# Patient Record
Sex: Male | Born: 1995 | Race: White | Hispanic: No | Marital: Single | Smoking: Never smoker
Health system: Southern US, Community
[De-identification: ages and names within clinical notes are randomized; demographics above are authoritative.]

## PROBLEM LIST (undated history)

## (undated) DIAGNOSIS — J45909 Unspecified asthma, uncomplicated: Secondary | ICD-10-CM

## (undated) HISTORY — DX: Unspecified asthma, uncomplicated: J45.909

## (undated) HISTORY — PX: OTHER SURGICAL HISTORY: SHX169

---
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
## 2015-01-14 ENCOUNTER — Telehealth: Payer: Self-pay | Admitting: Internal Medicine

## 2015-01-14 MED ORDER — ALBUTEROL SULFATE HFA 108 (90 BASE) MCG/ACT IN AERS: 2.0000 | INHALATION_SPRAY | Freq: Four times a day (QID) | RESPIRATORY_TRACT | Status: AC | PRN

## 2015-01-14 NOTE — Telephone Encounter (Signed)
[facility] insurance will not cover Ventolin but will Proair. Called and spoke to pt's mother and informed her of the changed. Pt's mother verbalized understanding and [facility] Nothing further needed.

## 2015-09-29 ENCOUNTER — Ambulatory Visit (INDEPENDENT_AMBULATORY_CARE_PROVIDER_SITE_OTHER): Payer: Managed Care, Other (non HMO)

## 2015-09-29 DIAGNOSIS — Z23 Encounter for immunization: Secondary | ICD-10-CM

## 2016-01-24 ENCOUNTER — Other Ambulatory Visit: Payer: Self-pay | Admitting: Internal Medicine

## 2016-04-19 ENCOUNTER — Encounter: Payer: Self-pay | Admitting: Family Medicine

## 2016-04-19 ENCOUNTER — Ambulatory Visit (INDEPENDENT_AMBULATORY_CARE_PROVIDER_SITE_OTHER): Payer: Managed Care, Other (non HMO) | Admitting: Family Medicine

## 2016-04-19 ENCOUNTER — Ambulatory Visit (INDEPENDENT_AMBULATORY_CARE_PROVIDER_SITE_OTHER): Payer: Managed Care, Other (non HMO)

## 2016-04-19 VITALS — BP 136/76 | HR 88 | Temp 98.1°F | Ht 74.0 in | Wt 270.8 lb

## 2016-04-19 DIAGNOSIS — S86911A Strain of unspecified muscle(s) and tendon(s) at lower leg level, right leg, initial encounter: Secondary | ICD-10-CM

## 2016-04-19 DIAGNOSIS — S86811A Strain of other muscle(s) and tendon(s) at lower leg level, right leg, initial encounter: Secondary | ICD-10-CM | POA: Diagnosis not present

## 2016-04-19 NOTE — Patient Instructions (Signed)
Use conservative measures with ibuprofen or Aleve and ice and elevation and gentle stretching, if not improved and still having giving him catching and popping in a week and then give us a call back and we will send orthopedic

## 2016-04-19 NOTE — Progress Notes (Signed)
BP 136/76 mmHg  Pulse 88  Temp(Src) 98.1 F (36.7 C) (Oral)  Ht 6\' 2"  (1.88 m)  Wt 270 lb 12.8 oz (122.834 kg)  BMI 34.75 kg/m2   Subjective:    Patient ID: Bryan French, male    DOB: 09/14/1996, 20 y.o.   MRN: [id number]  HPI: Bryan French is a 20 y.o. male presenting on 04/19/2016 for Knee Pain   HPI Right knee pain While playing basketball patient came down with his right knee on a basketball who at age and twisted his knee. This occurred 2 days ago and since that time he has been having some swelling in that knee and feels like it does give way on him sometimes but not necessary upstairs or downstairs but just randomly. He is able to walk on it for the most part. He also went down and had an anterior abrasion on his knee. He is able to weight-bear and has no decreased sensation or strength in that lower extremity.  Relevant past medical, surgical, family and social history reviewed and updated as indicated. Interim medical history since our last visit reviewed. Allergies and medications reviewed and updated.  Review of Systems  Constitutional: Negative for fever.  HENT: Negative for ear discharge and ear pain.   Eyes: Negative for discharge and visual disturbance.  Respiratory: Negative for shortness of breath and wheezing.   Cardiovascular: Negative for chest pain and leg swelling.  Gastrointestinal: Negative for abdominal pain, diarrhea and constipation.  Genitourinary: Negative for difficulty urinating.  Musculoskeletal: Positive for joint swelling and arthralgias. Negative for back pain and gait problem.  Skin: Negative for rash.  Neurological: Negative for syncope, light-headedness and headaches.  All other systems reviewed and are negative.   Per HPI unless specifically indicated above     Medication List       This list is accurate as of: 04/19/16  4:53 PM.  Always use your most recent med list.               albuterol 108 (90 Base) MCG/ACT inhaler    Commonly known as:  PROAIR HFA  Inhale 2 puffs into the lungs every 6 (six) hours as needed for wheezing or shortness of breath.           Objective:    BP 136/76 mmHg  Pulse 88  Temp(Src) 98.1 F (36.7 C) (Oral)  Ht 6\' 2"  (1.88 m)  Wt 270 lb 12.8 oz (122.834 kg)  BMI 34.75 kg/m2  Wt Readings from Last 3 Encounters:  04/19/16 270 lb 12.8 oz (122.834 kg)  03/10/14 244 lb 3.2 oz (110.768 kg) (99 %*, Z = 2.36)  01/22/14 244 lb 3.2 oz (110.768 kg) (99 %*, Z = 2.37)   * Growth percentiles are based on CDC 2-20 Years data.    Physical Exam  Constitutional: He is oriented to person, place, and time. He appears well-developed and well-nourished. No distress.  Eyes: Conjunctivae and EOM are normal. Pupils are equal, round, and reactive to light. Right eye exhibits no discharge. No scleral icterus.  Neck: Neck supple. No thyromegaly present.  Cardiovascular: Normal rate, regular rhythm, normal heart sounds and intact distal pulses.   No murmur heard. Pulmonary/Chest: Effort normal and breath sounds normal. No respiratory distress. He has no wheezes.  Musculoskeletal: Normal range of motion. He exhibits tenderness (Right diffuse knee pain with mild effusion. No overlying erythema or warmth. No grinding popping or catching or weak ligaments are noted on exam.). He  exhibits no edema.  Lymphadenopathy:    He has no cervical adenopathy.  Neurological: He is alert and oriented to person, place, and time. Coordination normal.  Skin: Skin is warm and dry. No rash noted. He is not diaphoretic.  Psychiatric: He has a normal mood and affect. His behavior is normal.  Nursing note and vitals reviewed.     Assessment & Plan:   Problem List Items Addressed This Visit    None    Visit Diagnoses    Knee strain, right, initial encounter    -  Primary    Relevant Orders    DG Knee 1-2 Views Right       Use conservative measures with ibuprofen or Aleve and ice and elevation and gentle  stretching, if not improved and still having giving him catching and popping in a week and then give us a call back and we will send orthopedic  Follow up plan: Return if symptoms worsen or fail to improve.  Counseling provided for all of the vaccine components Orders Placed This Encounter  Procedures  . DG Knee 1-2 Views Right    Arville CareJoshua Jimena Wieczorek, MD [facility] 04/19/2016, 4:53 PM

## 2016-04-21 NOTE — Progress Notes (Signed)
Patient aware.

## 2016-09-07 ENCOUNTER — Ambulatory Visit (INDEPENDENT_AMBULATORY_CARE_PROVIDER_SITE_OTHER): Payer: Managed Care, Other (non HMO)

## 2016-09-07 DIAGNOSIS — Z23 Encounter for immunization: Secondary | ICD-10-CM | POA: Diagnosis not present

## 2017-04-23 ENCOUNTER — Encounter: Payer: Self-pay | Admitting: Family Medicine

## 2017-04-23 ENCOUNTER — Ambulatory Visit (INDEPENDENT_AMBULATORY_CARE_PROVIDER_SITE_OTHER): Payer: Managed Care, Other (non HMO) | Admitting: Family Medicine

## 2017-04-23 VITALS — BP 126/74 | HR 67 | Temp 98.0°F | Ht 74.0 in | Wt 262.4 lb

## 2017-04-23 DIAGNOSIS — J029 Acute pharyngitis, unspecified: Secondary | ICD-10-CM

## 2017-04-23 DIAGNOSIS — J039 Acute tonsillitis, unspecified: Secondary | ICD-10-CM

## 2017-04-23 LAB — RAPID STREP SCREEN (MED CTR MEBANE ONLY): STREP GP A AG, IA W/REFLEX: NEGATIVE

## 2017-04-23 LAB — CULTURE, GROUP A STREP

## 2017-04-23 MED ORDER — DOXYCYCLINE HYCLATE 100 MG PO TABS: 100.0000 mg | ORAL_TABLET | Freq: Every day | ORAL | 5 refills | Status: AC

## 2017-04-23 MED ORDER — AMOXICILLIN-POT CLAVULANATE 875-125 MG PO TABS: 1.0000 | ORAL_TABLET | Freq: Two times a day (BID) | ORAL | 0 refills | Status: AC

## 2017-04-23 NOTE — Progress Notes (Signed)
Chief Complaint  Patient presents with  . Adenopathy  . Nausea    HPI  Patient presents today for 3-4 days swelling in the back on the right side. It is sore when swallowing but he is able to eat normally. He is having about 3 loose bowel movements per day for the last 2-3 days. No fever chills sweats. And no abdominal discomfort. Patient also states that he developed a case of acne over the last few weeks although he never really had any significant amount while in his teens. He is a weight lifter but he denies use of steroids. Acne located primarily on the upper back shoulders and upper arms.  PMH: Smoking status noted ROS: Review of Systems  Constitutional: Negative for chills, diaphoresis and fever.  HENT: Positive for sore throat. Negative for congestion.   Eyes: Negative.   Respiratory: Negative for cough and shortness of breath.   Cardiovascular: Negative for chest pain and palpitations.  Gastrointestinal: Positive for diarrhea. Negative for abdominal pain, nausea and vomiting.  Musculoskeletal: Negative for joint pain.  Skin: Negative for rash.  Neurological: Positive for headaches. Negative for dizziness.  Psychiatric/Behavioral: Negative.      Objective: BP 126/74   Pulse 67   Temp 98 F (36.7 C) (Oral)   Ht 6\' 2"  (1.88 m)   Wt 262 lb 6.4 oz (119 kg)   BMI 33.69 kg/m  Gen: NAD, alert, cooperative with exam HEENT: NCAT, EOMI, PERRL CV: RRR, good S1/S2, no murmur Resp: CTABL, no wheezes, non-labored Abd: SNTND, BS present, no guarding or organomegaly Ext: No edema, warm Neuro: Alert and oriented, No gross deficits  Assessment and plan:  1. Sore throat   2. Tonsillitis     Meds ordered this encounter  Medications  . DUEXIS 800-26.6 MG TABS    Sig: Take 1 tablet by mouth 3 (three) times daily as needed. for pain    Refill:  3  . amoxicillin-clavulanate (AUGMENTIN) 875-125 MG tablet    Sig: Take 1 tablet by mouth 2 (two) times daily. Take all of this  medication    Dispense:  20 tablet    Refill:  0  . doxycycline (VIBRA-TABS) 100 MG tablet    Sig: Take 1 tablet (100 mg total) by mouth daily.    Dispense:  30 tablet    Refill:  5    Orders Placed This Encounter  Procedures  . [facility] ([facility]  . Culture, Group A Strep    Order Specific Question:   Source    Answer:   oral  . Culture, Group A Strep    Follow up as needed.  Mechele ClaudeWarren Olukemi Panchal, MD

## 2017-04-23 NOTE — Patient Instructions (Signed)
Do not take the doxycycline for acne until you have finished taking the Augmentin for the tonsillitis.  Use proactive face cleansers and acne creams on your back shoulders and face for the acne. You can start that right away.  Imodium AD take 1-2 tablets 4 times a day until the diarrhea clears. It is available over-the-counter

## 2017-04-26 LAB — CULTURE, GROUP A STREP

## 2017-05-11 ENCOUNTER — Encounter: Payer: Self-pay | Admitting: *Deleted

## 2017-10-06 IMAGING — DX DG KNEE 1-2V*R*
2 series · 2 of 2 positions shown · non-contrast
Comparison: MRI of the right knee June 10, 2015

CLINICAL DATA: Twisting injury while playing basketball ; previous
ACL surgery.

EXAM:
RIGHT KNEE - 1-2 VIEW

[knee ap]
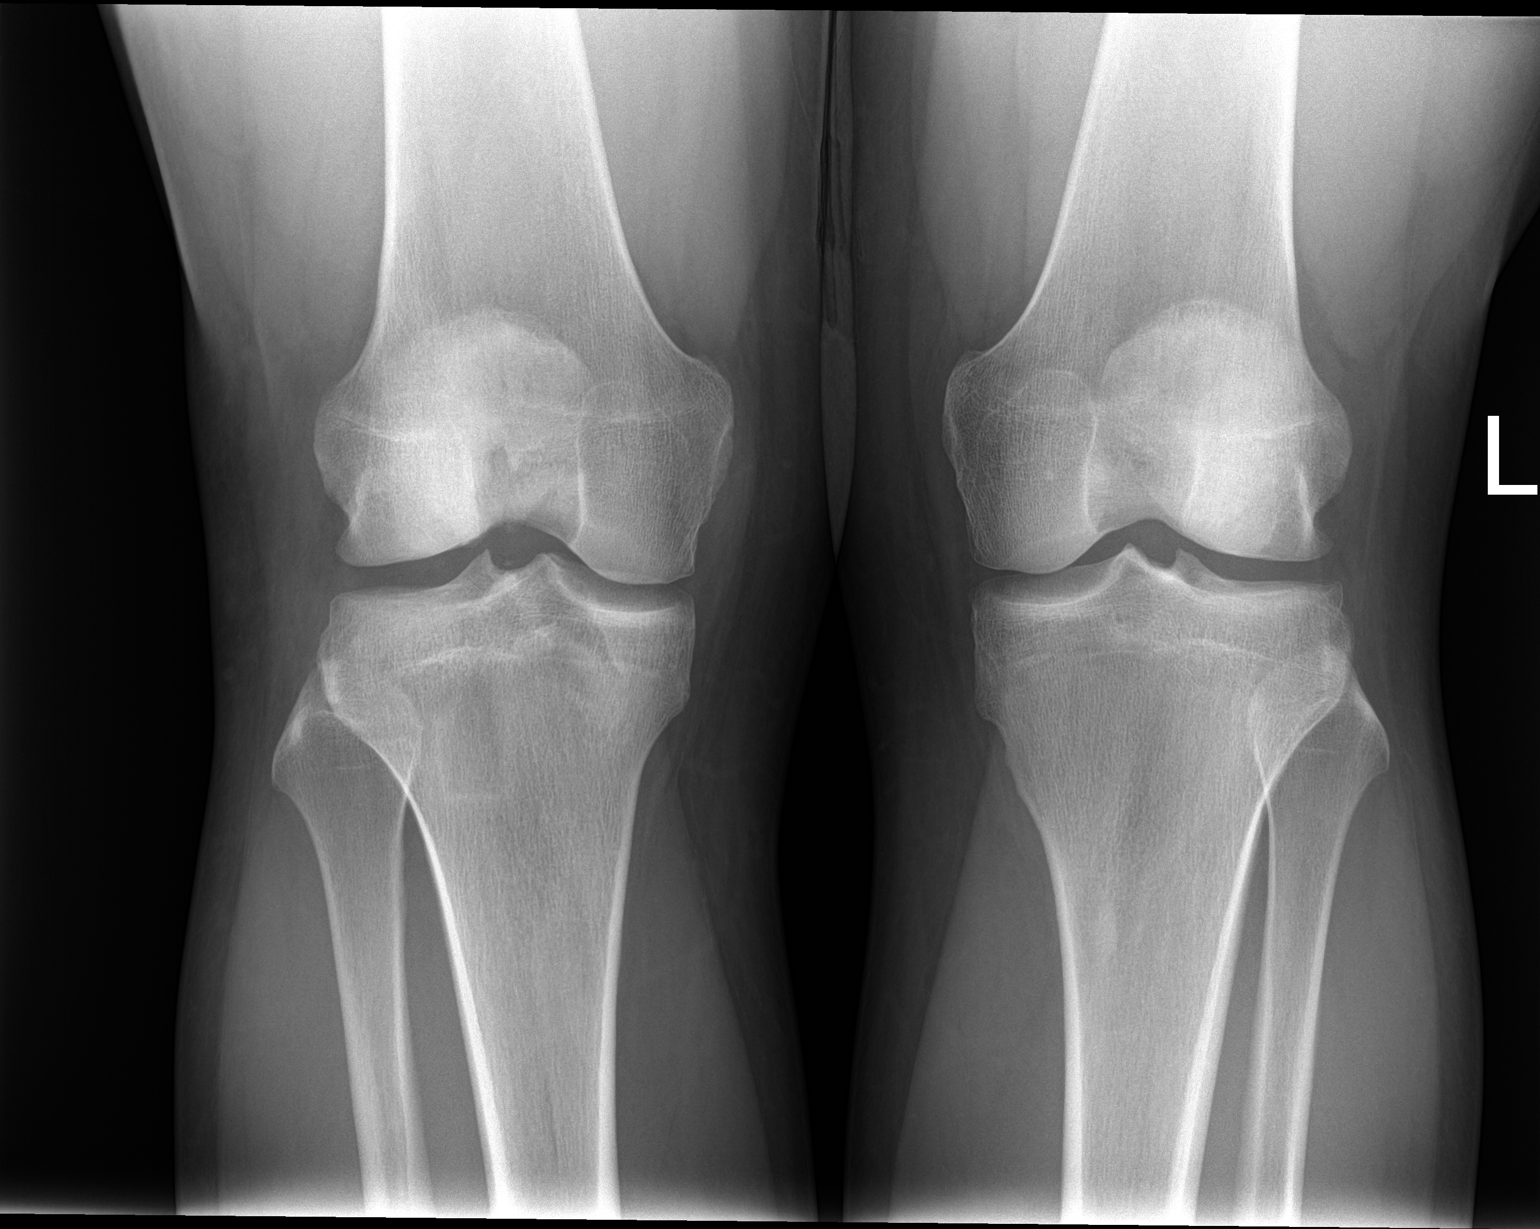

[knee lat]
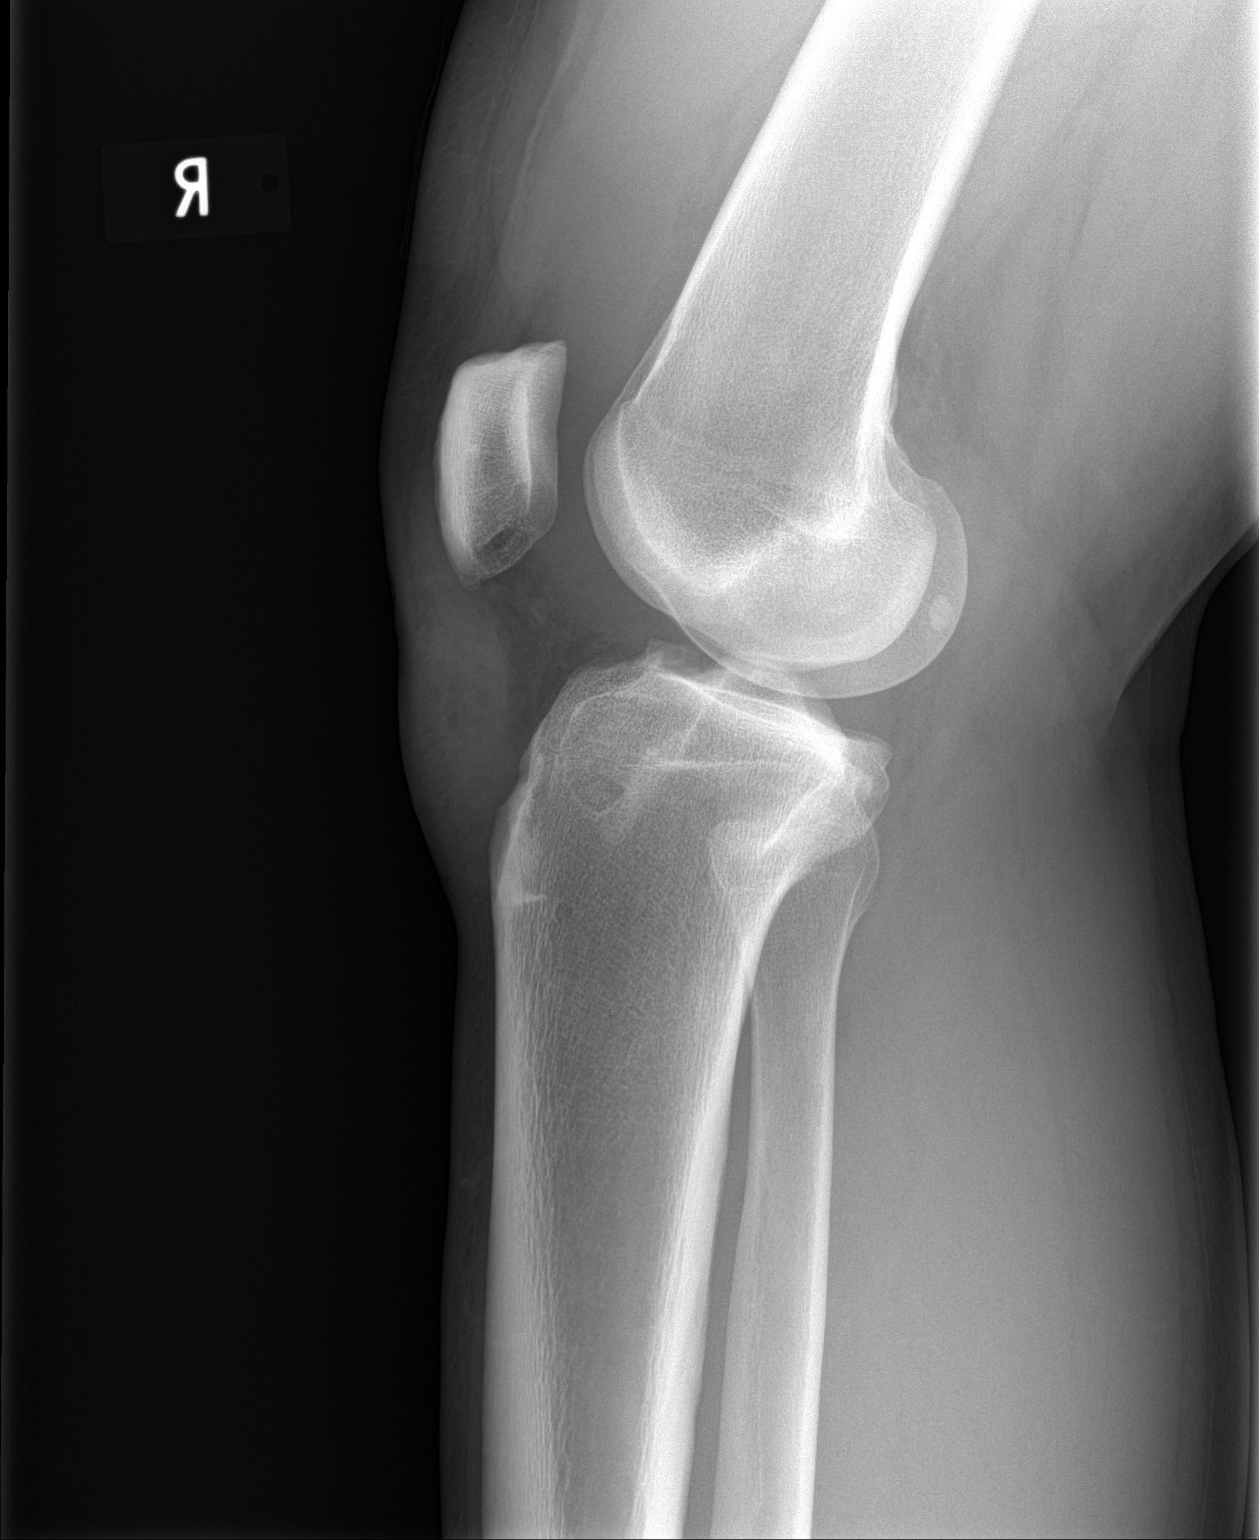

[2 of 2 positions shown; findings below may reference images not displayed]

FINDINGS: The bones of the knees appear adequately mineralized. Post ACL
repair changes on the right are observed. There is no acute fracture
nor dislocation. The joint spaces are well maintained. There is a
suprapatellar joint effusion.
IMPRESSION: No acute bony abnormality.  Suprapatellar effusion on the right.
